# Patient Record
Sex: Female | Born: 1960 | Race: Black or African American | Hispanic: No | Smoking: Current every day smoker
Health system: Southern US, Community
[De-identification: ages and names within clinical notes are randomized; demographics above are authoritative.]

---

## 2018-04-17 ENCOUNTER — Encounter (HOSPITAL_COMMUNITY): Payer: Self-pay | Admitting: *Deleted

## 2018-04-17 ENCOUNTER — Emergency Department (HOSPITAL_COMMUNITY)
Admission: EM | Admit: 2018-04-17 | Discharge: 2018-04-17 | Disposition: A | Discharge status: Home / Self Care | Payer: No Typology Code available for payment source | Attending: Emergency Medicine | Admitting: Emergency Medicine

## 2018-04-17 ENCOUNTER — Emergency Department (HOSPITAL_COMMUNITY): Payer: No Typology Code available for payment source

## 2018-04-17 ENCOUNTER — Other Ambulatory Visit: Payer: Self-pay

## 2018-04-17 DIAGNOSIS — Y999 Unspecified external cause status: Secondary | ICD-10-CM | POA: Insufficient documentation

## 2018-04-17 DIAGNOSIS — S161XXA Strain of muscle, fascia and tendon at neck level, initial encounter: Secondary | ICD-10-CM | POA: Diagnosis not present

## 2018-04-17 DIAGNOSIS — S298XXA Other specified injuries of thorax, initial encounter: Secondary | ICD-10-CM | POA: Diagnosis not present

## 2018-04-17 DIAGNOSIS — S3991XA Unspecified injury of abdomen, initial encounter: Secondary | ICD-10-CM | POA: Diagnosis not present

## 2018-04-17 DIAGNOSIS — M549 Dorsalgia, unspecified: Secondary | ICD-10-CM | POA: Insufficient documentation

## 2018-04-17 DIAGNOSIS — S8011XA Contusion of right lower leg, initial encounter: Secondary | ICD-10-CM | POA: Insufficient documentation

## 2018-04-17 DIAGNOSIS — R079 Chest pain, unspecified: Secondary | ICD-10-CM | POA: Insufficient documentation

## 2018-04-17 DIAGNOSIS — F17200 Nicotine dependence, unspecified, uncomplicated: Secondary | ICD-10-CM | POA: Insufficient documentation

## 2018-04-17 DIAGNOSIS — S40021A Contusion of right upper arm, initial encounter: Secondary | ICD-10-CM | POA: Insufficient documentation

## 2018-04-17 DIAGNOSIS — R51 Headache: Secondary | ICD-10-CM | POA: Diagnosis not present

## 2018-04-17 DIAGNOSIS — S8012XA Contusion of left lower leg, initial encounter: Secondary | ICD-10-CM | POA: Diagnosis not present

## 2018-04-17 DIAGNOSIS — S40022A Contusion of left upper arm, initial encounter: Secondary | ICD-10-CM | POA: Diagnosis not present

## 2018-04-17 DIAGNOSIS — S199XXA Unspecified injury of neck, initial encounter: Secondary | ICD-10-CM | POA: Diagnosis present

## 2018-04-17 DIAGNOSIS — Y92410 Unspecified street and highway as the place of occurrence of the external cause: Secondary | ICD-10-CM | POA: Diagnosis not present

## 2018-04-17 DIAGNOSIS — Y939 Activity, unspecified: Secondary | ICD-10-CM | POA: Insufficient documentation

## 2018-04-17 LAB — COMPREHENSIVE METABOLIC PANEL
ALT: 16 U/L (ref 0–44)
AST: 20 U/L (ref 15–41)
Albumin: 3.7 g/dL (ref 3.5–5.0)
Alkaline Phosphatase: 88 U/L (ref 38–126)
Anion gap: 9 (ref 5–15)
BUN: 16 mg/dL (ref 6–20)
CALCIUM: 8.8 mg/dL — AB (ref 8.9–10.3)
CO2: 27 mmol/L (ref 22–32)
Chloride: 107 mmol/L (ref 98–111)
Creatinine, Ser: 0.9 mg/dL (ref 0.44–1.00)
GFR calc Af Amer: >60 mL/min (ref >60)
GLUCOSE: 114 mg/dL — AB (ref 70–99)
Potassium: 4.2 mmol/L (ref 3.5–5.1)
Sodium: 143 mmol/L (ref 135–145)
Total Bilirubin: 0.4 mg/dL (ref 0.3–1.2)
Total Protein: 7.4 g/dL (ref 6.5–8.1)

## 2018-04-17 LAB — CBC
HCT: 41.7 % (ref 36.0–46.0)
Hemoglobin: 12.7 g/dL (ref 12.0–15.0)
MCH: 28.8 pg (ref 26.0–34.0)
MCHC: 30.5 g/dL (ref 30.0–36.0)
MCV: 94.6 fL (ref 80.0–100.0)
NRBC: 0 % (ref 0.0–0.2)
PLATELETS: 176 10*3/uL (ref 150–400)
RBC: 4.41 MIL/uL (ref 3.87–5.11)
RDW: 13.2 % (ref 11.5–15.5)
WBC: 8.2 10*3/uL (ref 4.0–10.5)

## 2018-04-17 LAB — LIPASE, BLOOD: LIPASE: 41 U/L (ref 11–51)

## 2018-04-17 LAB — I-STAT BETA HCG BLOOD, ED (MC, WL, AP ONLY): I-stat hCG, quantitative: <5 m[IU]/mL (ref <5)

## 2018-04-17 MED ORDER — SODIUM CHLORIDE 0.9 % IJ SOLN
INTRAMUSCULAR | Status: AC
  Filled 2018-04-17: qty 50

## 2018-04-17 MED ORDER — FENTANYL CITRATE (PF) 100 MCG/2ML IJ SOLN
50.0000 ug | Freq: Once | INTRAMUSCULAR | Status: AC
  Administered 2018-04-17: 50 ug via INTRAVENOUS
  Filled 2018-04-17: qty 2

## 2018-04-17 MED ORDER — IBUPROFEN 400 MG PO TABS: 400.0000 mg | ORAL_TABLET | Freq: Three times a day (TID) | ORAL | 0 refills | Status: AC | PRN

## 2018-04-17 MED ORDER — IOPAMIDOL (ISOVUE-300) INJECTION 61%
INTRAVENOUS | Status: AC
  Filled 2018-04-17: qty 100

## 2018-04-17 MED ORDER — IOPAMIDOL (ISOVUE-300) INJECTION 61%
100.0000 mL | Freq: Once | INTRAVENOUS | Status: AC | PRN
  Administered 2018-04-17: 100 mL via INTRAVENOUS

## 2018-04-17 MED ORDER — CYCLOBENZAPRINE HCL 10 MG PO TABS: 10.0000 mg | ORAL_TABLET | Freq: Two times a day (BID) | ORAL | 0 refills | Status: AC | PRN

## 2018-04-17 NOTE — Discharge Instructions (Signed)
Motrin and flexeril were sent to your pharmacy. Please pick up your prescriptions and take it as prescribed.   See your doctor  Return to ER if you have worse abdominal pain, chest pain, headaches, vomiting
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
## 2018-04-17 NOTE — ED Notes (Signed)
ED Provider at bedside. 

## 2018-04-17 NOTE — ED Notes (Signed)
Patient transported to CT 
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
